# Patient Record
Sex: Male | Born: 1965 | Race: White | Hispanic: No | Marital: Married
Health system: Midwestern US, Academic
[De-identification: ages and names within clinical notes are randomized; demographics above are authoritative.]

---

## 2021-04-18 ENCOUNTER — Encounter: Admit: 2021-04-18 | Discharge: 2021-04-18

## 2021-04-18 DIAGNOSIS — I610 Nontraumatic intracerebral hemorrhage in hemisphere, subcortical: Secondary | ICD-10-CM

## 2021-04-18 DIAGNOSIS — E785 Hyperlipidemia, unspecified: Secondary | ICD-10-CM

## 2021-04-18 DIAGNOSIS — I10 Essential (primary) hypertension: Secondary | ICD-10-CM

## 2021-04-18 LAB — COMPREHENSIVE METABOLIC PANEL
ALBUMIN: 4.5 g/dL (ref 3.5–5.0)
ALK PHOSPHATASE: 60 U/L (ref 25–110)
ALT: 29 U/L (ref 7–56)
ANION GAP: 13 — ABNORMAL HIGH (ref 3–12)
AST: 32 U/L (ref 7–40)
BLD UREA NITROGEN: 16 mg/dL (ref 7–25)
CALCIUM: 9.2 mg/dL (ref 8.5–10.6)
CO2: 20 MMOL/L — ABNORMAL LOW (ref 21–30)
CREATININE: 0.8 mg/dL (ref 0.4–1.24)
EGFR: >60 mL/min (ref >60)
GLUCOSE,PANEL: 82 mg/dL (ref 70–100)
SODIUM: 137 MMOL/L (ref 137–147)
TOTAL BILIRUBIN: 0.5 mg/dL (ref 0.3–1.2)

## 2021-04-18 LAB — PTT (APTT): PTT: 32 s (ref 24.0–36.5)

## 2021-04-18 LAB — LIPID PROFILE
CHOLESTEROL: 202 mg/dL — ABNORMAL HIGH (ref <200)
HDL: 49 mg/dL (ref >40)
LDL: 122 mg/dL — ABNORMAL HIGH (ref <100)
NON HDL CHOLESTEROL: 153 mg/dL
TRIGLYCERIDES: 126 mg/dL (ref <150)
VLDL: 25 mg/dL

## 2021-04-18 LAB — PHOSPHORUS: PHOSPHORUS: 2.9 mg/dL (ref 2.0–4.5)

## 2021-04-18 LAB — MAGNESIUM: MAGNESIUM: 2.3 mg/dL (ref 1.6–2.6)

## 2021-04-18 LAB — IONIZED CALCIUM: IONIZED CALCIUM: 1 MMOL/L (ref 1.0–1.3)

## 2021-04-18 LAB — PROTIME INR (PT): PROTIME: 11 s (ref 9.5–14.2)

## 2021-04-18 MED ORDER — LISINOPRIL 10 MG PO TAB
10 mg | Freq: Every day | ORAL | 0 refills | Status: AC
Start: 2021-04-18 — End: ?
  Administered 2021-04-19: 14:00:00 10 mg via ORAL

## 2021-04-18 MED ORDER — POTASSIUM CHLORIDE IN WATER 10 MEQ/50 ML IV PGBK
10 meq | INTRAVENOUS | 0 refills | Status: AC | PRN
Start: 2021-04-18 — End: ?

## 2021-04-18 MED ORDER — DOCUSATE SODIUM 100 MG PO CAP
100 mg | Freq: Two times a day (BID) | ORAL | 0 refills | Status: AC
Start: 2021-04-18 — End: ?

## 2021-04-18 MED ORDER — POTASSIUM CHLORIDE 20 MEQ/15 ML PO LIQD
40-60 meq | NASOGASTRIC | 0 refills | Status: AC | PRN
Start: 2021-04-18 — End: ?

## 2021-04-18 MED ORDER — LABETALOL 5 MG/ML IV SYRG
10 mg | INTRAVENOUS | 0 refills | Status: AC | PRN
Start: 2021-04-18 — End: ?

## 2021-04-18 MED ORDER — MAGNESIUM SULFATE IN D5W 1 GRAM/100 ML IV PGBK
1 g | INTRAVENOUS | 0 refills | Status: AC | PRN
Start: 2021-04-18 — End: ?

## 2021-04-18 MED ORDER — HYDRALAZINE 20 MG/ML IJ SOLN
10 mg | INTRAVENOUS | 0 refills | Status: AC | PRN
Start: 2021-04-18 — End: ?

## 2021-04-18 MED ORDER — ACETAMINOPHEN 325 MG PO TAB
650 mg | ORAL | 0 refills | Status: AC | PRN
Start: 2021-04-18 — End: ?

## 2021-04-18 MED ORDER — ONDANSETRON HCL (PF) 4 MG/2 ML IJ SOLN
4 mg | INTRAVENOUS | 0 refills | Status: AC | PRN
Start: 2021-04-18 — End: ?

## 2021-04-18 MED ORDER — POTASSIUM CHLORIDE 20 MEQ PO TBTQ
40-60 meq | ORAL | 0 refills | Status: AC | PRN
Start: 2021-04-18 — End: ?

## 2021-04-18 MED ORDER — ACETAMINOPHEN 160 MG/5 ML PO SOLN
650 mg | NASOGASTRIC | 0 refills | Status: AC | PRN
Start: 2021-04-18 — End: ?

## 2021-04-18 MED ORDER — MAGNESIUM HYDROXIDE 2,400 MG/10 ML PO SUSP
10 mL | Freq: Every day | ORAL | 0 refills | Status: AC
Start: 2021-04-18 — End: ?

## 2021-04-18 MED ORDER — SENNOSIDES-DOCUSATE SODIUM 8.6-50 MG PO TAB
1 | Freq: Two times a day (BID) | ORAL | 0 refills | Status: AC
Start: 2021-04-18 — End: ?

## 2021-04-18 MED ORDER — ATORVASTATIN 20 MG PO TAB
20 mg | Freq: Every evening | ORAL | 0 refills | Status: DC
Start: 2021-04-18 — End: 2021-04-19

## 2021-04-18 MED ORDER — CALCIUM GLUC IN NACL, ISO-OSM 1 GRAM/100 ML IV SOLN
1 g | INTRAVENOUS | 0 refills | Status: AC | PRN
Start: 2021-04-18 — End: ?

## 2021-04-19 ENCOUNTER — Inpatient Hospital Stay: Admit: 2021-04-19 | Discharge: 2021-04-19 | Payer: Private Health Insurance - Indemnity

## 2021-04-19 ENCOUNTER — Inpatient Hospital Stay: Admit: 2021-04-19

## 2021-04-19 LAB — TSH WITH FREE T4 REFLEX: TSH: 1.4 uU/mL (ref 0.35–5.00)

## 2021-04-19 MED ORDER — ATORVASTATIN 40 MG PO TAB
40 mg | Freq: Every evening | ORAL | 0 refills | Status: AC
Start: 2021-04-19 — End: ?

## 2021-04-19 MED ORDER — LACTATED RINGERS IV SOLP
1000 mL | INTRAVENOUS | 0 refills | Status: AC
Start: 2021-04-19 — End: ?
  Administered 2021-04-19: 08:00:00 1000 mL via INTRAVENOUS

## 2021-04-19 MED ADMIN — GADOBENATE DIMEGLUMINE 529 MG/ML (0.1MMOL/0.2ML) IV SOLN [135881]: 20 mL | INTRAVENOUS | @ 10:00:00 | Stop: 2021-04-19 | NDC 00270516415

## 2021-04-19 NOTE — H&P (View-Only)
Neuro Critical Care History and Physical      Frank Hines  Admission Date: 04/18/2021  LOS: 0 days  No Order                      ASSESSMENT/PLAN     Patient Active Problem List    Diagnosis Date Noted   ? 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 Dyslipidemia    ? Primary hypertension    ? Distortion of visual image of left eye        Frank Hines is a 55 y.o. male with a PMH of hypertension and dyslipidemia. He works as a Emergency planning/management officer. On the evening of 12/8, he was searching a house when he developed a visual disturbance in his left visual field consisting of a beam of bright light. Symptoms did not improve and he sought treatment in the ED at [facility]. CT of the head demonstrated a small hyperdensity in the right thalamus concerning for hemorrhage.    Hospital and ICU course:   12/8: to [facility] ED, found to have right thalamic hyperdensity, transferred to [facility]    Neuro:   Thalamic hemorrhage  Left visual disturbance  - ICH order set  - Q2 neuro checks  - MRI head w/ & w/o contrast, MRA head w/o contrast  - obtain urine drug screen   - PT/OT consults     12/8 CT head:  Small 6 mm hyerdensity involving the posterior right thalamus suspicious for a small thalamic hemorrhage.     Neuro Critical Care ICH Score    GCS (3-4, or 5-12, or 13-15): 15  Age ? 80: no  ICH volume ? 30 ml: no  IVH present: no  Infratentorial origin of hemorrhage: no   Total score on admission: 0     Sedation/Pain Management:   - acetaminophen 650 Q4 PRN pain/headache  - Assess for delirium daily    Cardiac:   Primary hypertension   - SBP goal: < 160  - MAP goal > 65  - resume lisinopril 10 mg PO every day  - labetalol 10 mg IV Q15 PRN SBP > 160  - hydralazine 10 mg IV Q4 PRN SBP > 160  - EKG    Respiratory:    - PaO2 goal >100, Spo2 goal >92%    GI:  - AST 26, ALT 34, alk phos 66, total bili 0.51 at OSH  - Feeding: regular diet   - neuro bowel regimen, ensure daily BM    Heme:   - WBC 6.26, Hgb 14.3, Plt 235, INR 1.1 at OSH  - repeat CBC and coags now    ID:   - aim for normothermia, Temp <38.3 celsius, normothermia protocol if febrile    Renal:   - BUN 20, Cr 0.85 at OSH  - Monitor hourly I/O balance   - Aim for normovolemia    Endocrine:    Dyslipidemia  - Blood glucose goal 100-180mg /dl  - obtain lipid panel and TSH w/ reflex T4  - resume atorvastatin 20 mg PO QHS     FEN:   - Na 138, K 4.2, Ca 9.3 at OSH  - IVF: none  - Magnesium goal >2.0, i-Cal goal > 1.0, Potassium goal >4.0 mEq/L  - BMP, Mg, PO4, Ca++ now  - implement critical care electrolyte replacement protocol       Prophylaxis Review:   A) GI: not indicated   B) Lines:  No  C) Urinary Catheter:  No  D) Antibiotic Usage:  No  E) VTE:  Pharmacological prophylaxis; Contraindication:  Active bleeding; Cerebral hemorrhage  and Mechanical prophylaxis; Sequential compression device  F) Isolation: none  G)Seizures: none  I) Restraints: Patient assessed for need for restraints.   Disposition/Family:   Unchanged.    Primary service: neurocritical care     Consults:  None    ___________________________________________________________________  SUBJECTIVE   Chief Complaint: visual disturbance, thalamic IPH    History of Present Illness: Frank Hines is a 55 y.o. male with a history of hypertension and dyslipidemia. He works as a Emergency planning/management officer. On the evening of 12/8, he was searching a house when he developed a visual disturbance in his left visual field consisting of a beam of bright light. Symptoms did not improve and he sought treatment in the ED at [facility]. CT of the head demonstrated a small hyperdensity in the right thalamus concerning for hemorrhage.    Medical History:   Diagnosis Date   ? Dyslipidemia    ? Primary hypertension        Surgical History:   Procedure Laterality Date   ? TONSILLECTOMY         Family History   Problem Relation Age of Onset   ? Cancer-Breast Mother    ? Heart Attack Father    ? 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 He lives at home with his wife and 76 year old daughter.           Code Status: Full Code    Decision Maker: patient, surrogate is wife Frank Hines)     Immunizations (includes history and patient reported):   Immunization History   Administered Date(s) Administered   ? COVID-19 (MODERNA), mRNA vacc, 100 mcg/0.5 mL (PF) 06/09/2019, 07/07/2019   ? HEP A/HEP B Combined Vaccine 09/05/2013, 10/07/2013, 03/17/2014           Allergies:  Patient has no known allergies.    Medications Prior to Admission   Medication Sig   ? aspirin EC 81 mg tablet Take 81 mg by mouth daily. Take with food.   ? atorvastatin (LIPITOR) 20 mg tablet .COMPLEX   ? lisinopriL (ZESTRIL) 10 mg tablet .COMPLEX   ? MULTIVITAMIN PO Take  by mouth.       Review of Systems:  Review obtained from patient.  Constitutional: negative  Eyes: positive for visual disturbance  Ears, nose, mouth, throat, and face: positive for sinus pressure  Respiratory: negative  Cardiovascular: negative  Gastrointestinal: negative  Genitourinary:negative  Musculoskeletal:negative  Neurological: negative  Behavioral/Psych: negative      OBJECTIVE                     Vital Signs: Last Filed                  Vital Signs: 24 Hour Range           Intensity Pain Scale (Self Report): (not recorded) Vitals:    04/18/21 2250   Weight: 96.4 kg (212 lb 8.4 oz)         Artificial airway:  None              Ventilator/ Respiratory Therapy:  No   Vent weaning trial:  Not applicable    Lines:  Peripheral Line  Drains: None  Intake/Output Summary:  (Last 24 hours)  No intake or output data in the 24 hours ending 04/18/21 2315         Physical Exam:    Blood pressure (!) 157/100, weight 96.4 kg (212 lb 8.4 oz), SpO2 99 %.    Glasgow coma score: 15             E: 4 - Opens eyes on own          M: 6 - Follows simple motor commands              V: 5 - Alert and oriented    Neuro:   Mental Status: keenly alert, oriented x 3    Cranial Nerves: Cranial nerves 2-12 INTACT by inspection.      - Pupil exam: Size: 3 mm and briskly reactive bilaterally     - EOM: intact, no nystagmus                  - Corneal reflex: R - present L - present    - Grimace/facial movement: present      Motor:         RUE: Strength: 5/5                    RLE: Strength: 5/5            LUE: Strength: 5/5         LLE: Strength: 5/5   Sensory: normal    Lungs: clear to auscultation bilaterally                     Pulmonary:  airway patent, not on supplemental oxygen   Heart: regular rate and rhythm, S1, S2 normal, no murmur, click, rub or gallop  Abdomen: soft, non-tender. Bowel sounds normal. No masses,  no organomegaly  Extremities: extremities normal, atraumatic, no cyanosis or edema  Skin: Skin color, texture, turgor normal. No rashes or lesions    Point of Care Testing:  (Last 24 hours):       Lab Review:  Reviewed from OSH and noted as per above    Radiology and Other Diagnostic Procedures Review:  Pertinent radiologic and diagnostic procedures reviewed.      I spent 38 minutes managing the care of this patient. Frank Hines is in critical condition with suspected thalamic hemorrhage with visual disturbance. Will obtain MRI/MRA to evaluate for underlying mass or vascular anomaly. Cares included: detailed neurologic and systems exam, medication review, laboratory data review and interpretation, electrolyte management, review of available imaging, DVT/PE prophylaxis review, diet review, activity review, and coordination of care with consulted teams     Frank Muskrat, APRN-NP Date:  04/18/2021   [phone]

## 2021-04-19 NOTE — Progress Notes
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  Had flashlight in L hand above eye to search with and  saw lateral bright light in L eye.  Removed flashlight and continued to see light.  No HA, No N/V.    CT: 6 mm hyperdensity in posterior R thalmus suspicious for a small hemorrhage.  Needs MRI - not available at sending tonight.  Images being uploaded to the cloud.    PmHx: HTN, HLD    130/80 - SR 100- RR 16 - 97 - 97% on RA; A & O x 4    Neuro intact.  Visual loss in lateral fields  Flashing still intermittently present, especially when moves eye, even when eyelid closed.    Labs:  Not back    Wears contacts.

## 2021-04-25 ENCOUNTER — Encounter: Admit: 2021-04-25 | Discharge: 2021-04-25 | Payer: Private Health Insurance - Indemnity

## 2021-04-25 ENCOUNTER — Ambulatory Visit: Admit: 2021-04-25 | Discharge: 2021-04-25 | Payer: Private Health Insurance - Indemnity

## 2021-04-25 DIAGNOSIS — H2513 Age-related nuclear cataract, bilateral: Secondary | ICD-10-CM

## 2021-04-25 DIAGNOSIS — I10 Essential (primary) hypertension: Secondary | ICD-10-CM

## 2021-04-25 DIAGNOSIS — E785 Hyperlipidemia, unspecified: Secondary | ICD-10-CM

## 2021-04-25 DIAGNOSIS — H43393 Other vitreous opacities, bilateral: Principal | ICD-10-CM

## 2021-04-25 NOTE — Progress Notes
Body mass index is 31.01 kg/m.    OCT:  OD: Preserved foveal contour with distinguished retinal layers.  OS: Preserved foveal contour with distinguished retinal layers.    MRI demonstrates a small lesion in the thalamus which may represent a bleed vs vascular lesion per Dr.Whittaker's note.       Assessment and Plan:         Referral from Dr.Whottaker for photopsia      HTN on ttt  DLP on ttt  No DM    No heart, liver or kidney disease    No hx of migraines    1. Photopsia (cannot tell which eye)  New onset flashes last Thursday evening (04/18/2021), went to the ED on the same day  - Reports acute onset of bright crescent of light, worse in extremities of gaze, with no other visual or neurologic complaints  Reports decreasing flashes since last week  Not CNS related per neuro-ophthalmology note    2- Vitreous syneresis OU  No retinal tear, no retinal detachment, no vitreous hemorrhage, retina all attached.  Signs and symptoms of retinal tears and retinal detachment were reviewed in details with the patient. Patient was instructed to immediately present for evaluation or seek medical help if increased flashes, increased floaters, any decrease in vision, or curtain in field of vision.    F/u 6-8w or sooner prn  Oct, optos

## 2021-04-29 ENCOUNTER — Encounter: Admit: 2021-04-29 | Discharge: 2021-04-29 | Payer: Private Health Insurance - Indemnity

## 2021-04-29 NOTE — Telephone Encounter
Pt calling triage, he needs a letter lifting his restriction from driving. Tried to have his ICU team do it but it needs to be completed by ophthalmologist.

## 2021-05-02 ENCOUNTER — Encounter: Admit: 2021-05-02 | Discharge: 2021-05-02 | Payer: Private Health Insurance - Indemnity

## 2021-05-02 NOTE — Telephone Encounter
Message received -   Shariff Lasky MRN [id number] called, he was [facility] 12/9 with driving restrictions, "No driving until physician approval at follow-up appointment. Please obtain clearance from [facility] before operating a vehicle or machinery."   He saw [facility] today.  He says they verbally cleared him but their doc says that Neurology is who has to clear him to drive and return to work.  He's yet to hear from Neuro for post hospital follow up appointment.  Will you please call him [phone] and help him along with this? ....  Thank you in advance, [facility])    Patient now scheduled to see Dr. Consuella Lose on January 20th.    I saw Mr. Schiele in the hospital.  I called him on 12/21.  He reports that he is doing well.  I informed him that I would discuss with Ophthalmology and then get back to him.  Later on 12/21, I was able to reach Dr. Elpidio Anis.  Dr. Elpidio Anis had seen Mr. Vizcarrondo in outpatient clinic and reported that there was no vision reason that would restrict him from driving or returning to work.  I will write a release to work/driving note for him.    Staff name:  Karlene Einstein, MD Date:  05/02/2021

## 2021-05-31 ENCOUNTER — Ambulatory Visit: Admit: 2021-05-31 | Discharge: 2021-05-31 | Payer: BC Managed Care – PPO

## 2021-05-31 ENCOUNTER — Encounter: Admit: 2021-05-31 | Discharge: 2021-05-31 | Payer: BC Managed Care – PPO

## 2021-05-31 VITALS — BP 149/92 | HR 82 | Ht 69.0 in | Wt 210.0 lb

## 2021-05-31 DIAGNOSIS — Q283 Other malformations of cerebral vessels: Principal | ICD-10-CM

## 2021-05-31 DIAGNOSIS — E785 Hyperlipidemia, unspecified: Secondary | ICD-10-CM

## 2021-05-31 DIAGNOSIS — I10 Essential (primary) hypertension: Secondary | ICD-10-CM

## 2021-05-31 MED ORDER — LISINOPRIL 20 MG PO TAB
20 mg | ORAL_TABLET | Freq: Every day | ORAL | 3 refills | Status: AC
Start: 2021-05-31 — End: ?

## 2021-05-31 NOTE — Progress Notes
Date of Service: 05/31/2021    Subjective:             Frank Hines is a 56 y.o. male who is presenting post hospital follow up of a R thalamic ICH and visual change.     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 He had OSH CT brain showing a small R pulvinar hyperdensity c/w hemorrhage and he was brought to [place] where an MRI showed a lesion in the right pulvinar thalamus with an associated developmental venous anomaly, most compatible with a benign incidental vascular lesion such as a small cavernoma or atypical capillary telangiectasia. MRA was normal. Neurosurgery was consulted for this and did not recommend any acute Neurosurgical intervention. Spot EEG was also performed due to the positive symptoms that showed a bilateral independent temporal structural deficit. No epileptiform findings were seen.     The symptoms since discharge has improved but patient still something sees this bright crescent of light in left lateral field, especially when he is going outside from indoors, but it resolves soon. No other neurological symptoms. He has seen ophthalmology and found to have no visual field deficits or retinal issues. He has been cleared to drive and work. Regarding his history of HTN, he has been on Lisinopril 10 mg daily for many years and does not routinely check his BP at home.      Review of Systems   Constitutional: Negative.    HENT: Positive for tinnitus.    Eyes: Positive for visual disturbance.   Respiratory: Negative.    Cardiovascular: Negative.    Gastrointestinal: Negative.    Endocrine: Negative.    Genitourinary: Negative.    Musculoskeletal: Negative.    Skin: Negative.    Allergic/Immunologic: Negative.    Neurological: Negative.    Hematological: Negative.    Psychiatric/Behavioral: Negative. Objective:         ? aspirin EC 81 mg tablet Take 81 mg by mouth daily. Take with food.   ? atorvastatin (LIPITOR) 40 mg tablet Take one tablet by mouth at bedtime daily.   ? lisinopriL (ZESTRIL) 10 mg tablet Take 10 mg by mouth daily.   ? multivitamin (ONE-A-DAY) tablet Take 1 tablet by mouth daily.     Vitals:    05/31/21 1447   BP: (!) 149/92   Pulse: 82   PainSc: Zero   Weight: 95.3 kg (210 lb)   Height: 175.3 cm (5' 9)     Body mass index is 31.01 kg/m?Marland Kitchen     Physical Exam         General: Well-appearing, well nourished, in no apparent distress  HEENT: Normocephalic, atraumatic, moist mucus membranes  Extremities: Normal range of motion    Neuro Exam:  Mental Status: Alert and oriented to all modalities. Normal gross attention, memory, concentration, cognition  Speech: Fluent without dysarthria  CN: Visual fields full to confrontation, PERRLA, EOMI, no facial numbness, strength of facial muscles is full and symmetric, hearing intact to conversation, symmetric palate elevation, tongue midline, intact shoulder shrug  Motor: Normal tone/bulk, strength 5/5 throughout  Sensory: Grossly intact to light touch throughout  Coordination: Normal finger to nose   Gait: Normal, intact tandem gait     MRI brain and MRA head 04/19/21:  IMPRESSION  MR brain:     Stable small T1 hyperintense susceptibility lesion in the right pulvinar   thalamus with an associated developmental venous anomaly, most compatible   with a benign incidental vascular lesion such as a small cavernoma or   atypical capillary telangiectasia.     MRA head:     Normal MRI of the head.     Routine EEG:  Clinical correlation:   This abnormal routine EEG is indicative of a bilateral independent temporal structural deficit. No epileptiform findings are seen.      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   Symptoms of visual disturbance is likely related to this lesion due to time course (was present on CT brain at OSH) and that it is near the [facility] structure. Will need improved BP control to prevent future symptoms.   Plan:  - increase Lisinopril to 20 mg daily, recommend to record BP at home and f/u with PCP. Goal is less than 130/80.  - f/u MRI brain with and w/o contrast in 3 months from previous (around march 2023)  - at this time, no absolute contraidincation to Aspirin, but may decide to hold in future if new bleed or worsening findings on repeat MRI  - cleared to drive and work due to no visual field deficits    RTC as needed.       I spent 40 minutes with the patient, with 25 minutes in counseling and coordination of care.       Pierce Crane, MD

## 2021-05-31 NOTE — Patient Instructions
Go up to Lisinopril 20 mg daily. Keep a record of BP at home and bring into next PCP office. We want it to be less than 130/80. Also, if it's too low (90/50 or less), let us know.     Schedule MRI repeat in late March.

## 2021-06-13 ENCOUNTER — Ambulatory Visit: Admit: 2021-06-13 | Discharge: 2021-06-13 | Payer: BC Managed Care – PPO

## 2021-06-13 ENCOUNTER — Encounter: Admit: 2021-06-13 | Discharge: 2021-06-13 | Payer: BC Managed Care – PPO

## 2021-06-13 DIAGNOSIS — H2513 Age-related nuclear cataract, bilateral: Secondary | ICD-10-CM

## 2021-06-13 DIAGNOSIS — E785 Hyperlipidemia, unspecified: Secondary | ICD-10-CM

## 2021-06-13 DIAGNOSIS — H43393 Other vitreous opacities, bilateral: Secondary | ICD-10-CM

## 2021-06-13 DIAGNOSIS — I10 Essential (primary) hypertension: Secondary | ICD-10-CM

## 2021-06-13 NOTE — Progress Notes
Body mass index is 31.01 kg/m.       OCT:  OD: Preserved foveal contour with distinguished retinal layers.  OS: Preserved foveal contour with distinguished retinal layers.      B-scan:  OD: vitreous syneresis. No rt, no rd  OS:  vitreous syneresis. No rt, no rd        Assessment and Plan:         Referral from Dr.Whottaker for photopsia      HTN on ttt  DLP on ttt  No DM    No heart, liver or kidney disease    No hx of migraines    1.Photopsia (cannot tell which eye)  New onset flashes last Thursday evening (04/18/2021), went to the ED on the same day  -Reports acute onset of bright crescent of light, worse in extremities of gaze, with no other visual or neurologic complaints  Reports decreasing flashes since last week  Not CNS related per neuro-ophthalmology note    2- Vitreous syneresis OU  No retinal tear, no retinal detachment, no vitreous hemorrhage, retina all attached.  Signs and symptoms of retinal tears and retinal detachment were reviewed in details with the patient. Patient was instructed to immediately present for evaluation or seek medical help if increased flashes, increased floaters, any decrease in vision, or curtain in field of vision.    F/u 6-23m or sooner prn  Oct, optos

## 2021-07-29 ENCOUNTER — Ambulatory Visit: Admit: 2021-07-29 | Discharge: 2021-07-29 | Payer: BC Managed Care – PPO

## 2021-07-29 ENCOUNTER — Encounter: Admit: 2021-07-29 | Discharge: 2021-07-29 | Payer: BC Managed Care – PPO

## 2021-07-29 DIAGNOSIS — Q283 Other malformations of cerebral vessels: Secondary | ICD-10-CM

## 2021-07-29 MED ORDER — GADOBENATE DIMEGLUMINE 529 MG/ML (0.1MMOL/0.2ML) IV SOLN
20 mL | Freq: Once | INTRAVENOUS | 0 refills | Status: CP
Start: 2021-07-29 — End: ?
  Administered 2021-07-29: 19:00:00 20 mL via INTRAVENOUS

## 2021-07-30 ENCOUNTER — Encounter: Admit: 2021-07-30 | Discharge: 2021-07-30 | Payer: BC Managed Care – PPO

## 2021-07-30 NOTE — Telephone Encounter
-----   Message from Gustavo LahSabreena J Slavin, MD sent at 07/30/2021 12:16 PM CDT -----  Please let Mr. Frank Hines know that his MRI is consistent with a small collection of blood vessels similar to the previous scan. This does not need any further follow up unless he has any new symptoms. He should continue keeping his BP under control to reduce risk of this bleeding again.   ----- Message -----  From: Pricilla HolmKu Interface, In Radiant Results  Sent: 07/30/2021  11:39 AM CDT  To: Gustavo LahSabreena J Slavin, MD

## 2021-07-30 NOTE — Telephone Encounter
Went over MRI results with Rosie Fate per Dr. Daron Offer review.  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  He thanked me for calling.

## 2022-01-14 ENCOUNTER — Encounter: Admit: 2022-01-14 | Discharge: 2022-01-14 | Payer: BC Managed Care – PPO

## 2022-08-31 ENCOUNTER — Encounter: Admit: 2022-08-31 | Discharge: 2022-08-31 | Payer: BC Managed Care – PPO

## 2022-08-31 MED ORDER — LISINOPRIL 20 MG PO TAB
20 mg | ORAL_TABLET | Freq: Every day | ORAL | 0 refills
Start: 2022-08-31 — End: ?

## 2022-11-28 IMAGING — CT BRAIN WO(Adult)
2 of 4 series · 12 of 47 positions shown, 15 images · non-contrast
Comparison: none

[Series 4: brain cor 5.00 hr40 s3 · coronal · 0.33mm/px · 3 of 49 slices shown]
[im 17/49  brain]
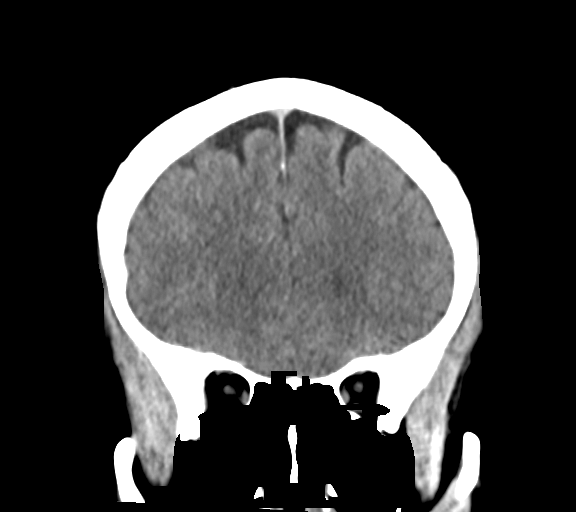
[im 22/49  brain]
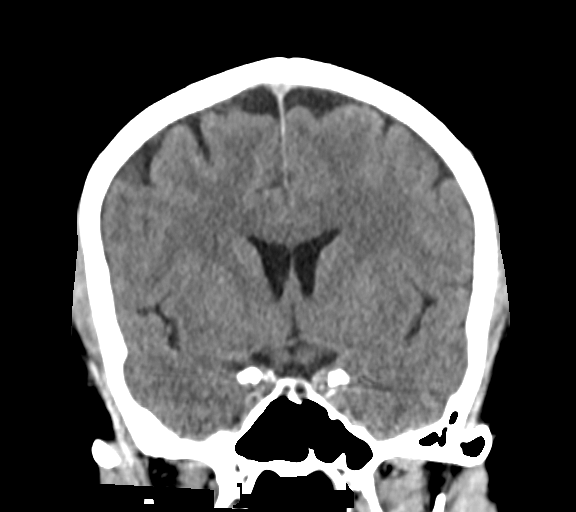
[im 27/49  brain]
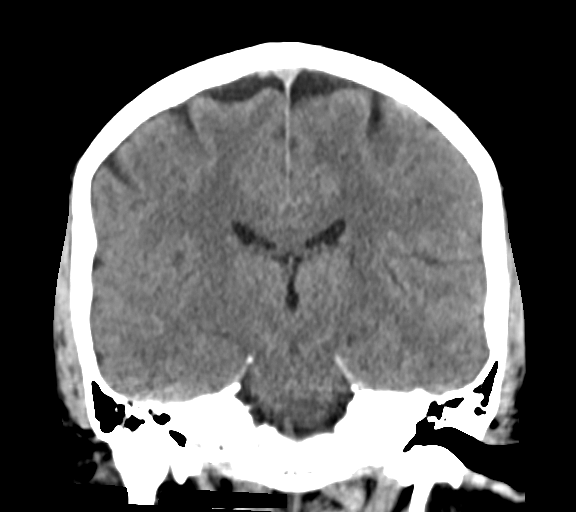

[Series 8: brain ax 2.00 hr60 s3 · axial · 0.37mm/px · z∈[-499,-363]mm · 9 of 86 slices shown, 12 images]
[im 9/86  brain]
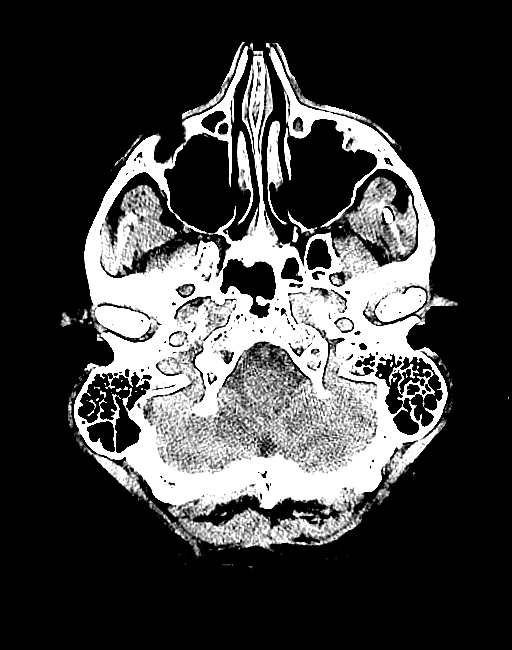
[im 9/86  bone]
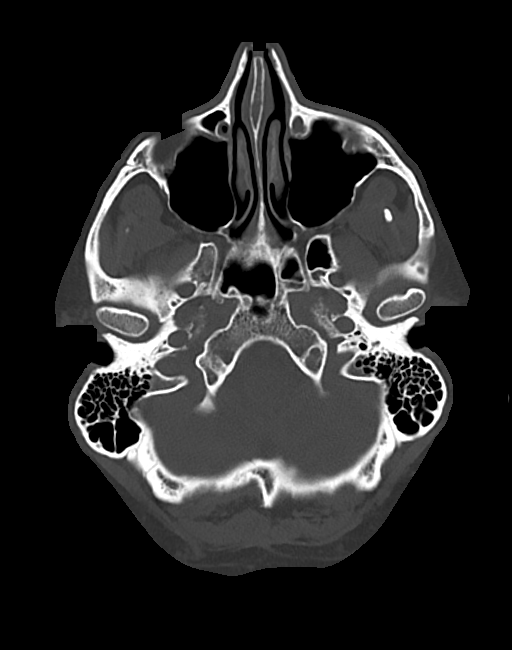
[im 17/86  brain]
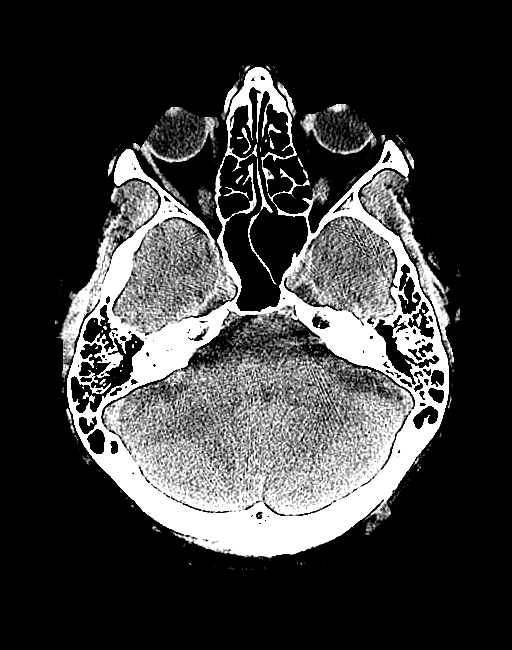
[im 25/86  brain]
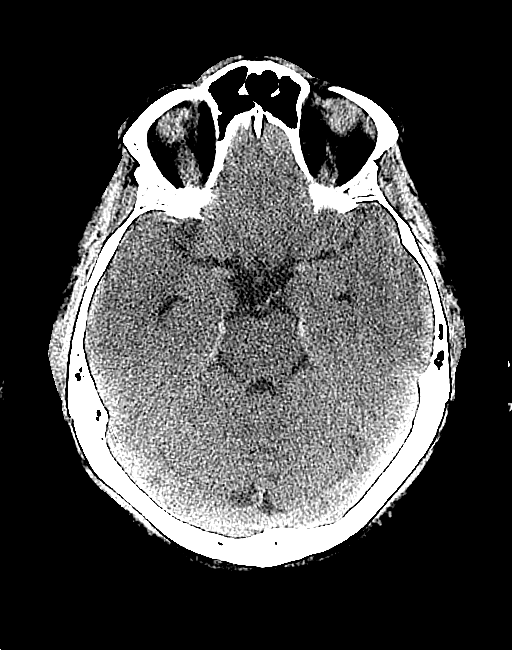
[im 33/86  brain]
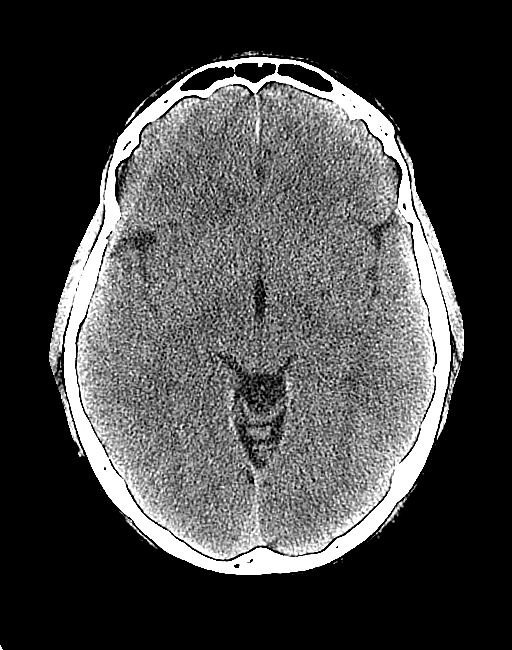
[im 45/86  brain]
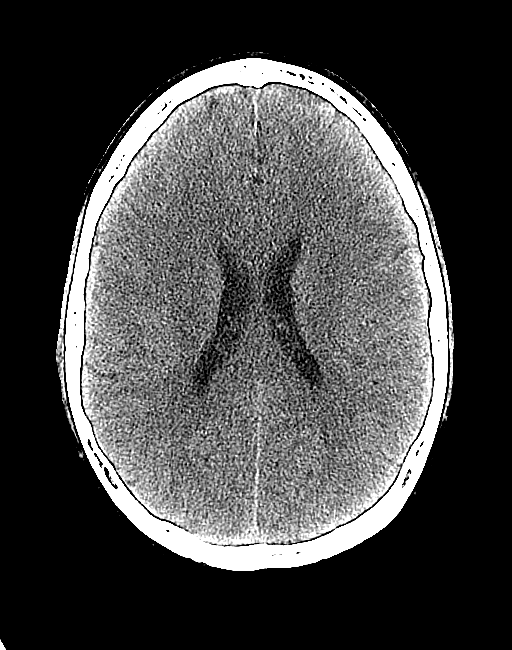
[im 45/86  bone]
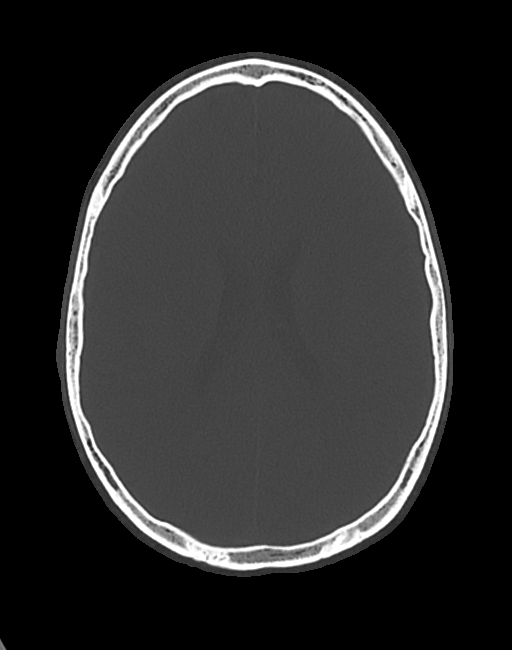
[im 53/86  brain]
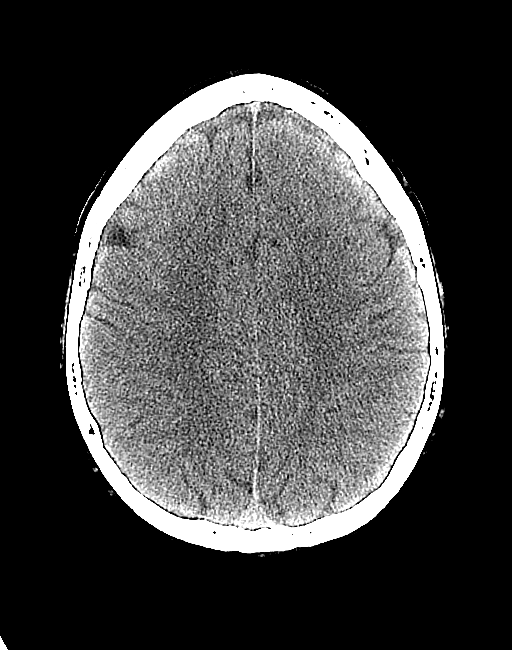
[im 61/86  brain]
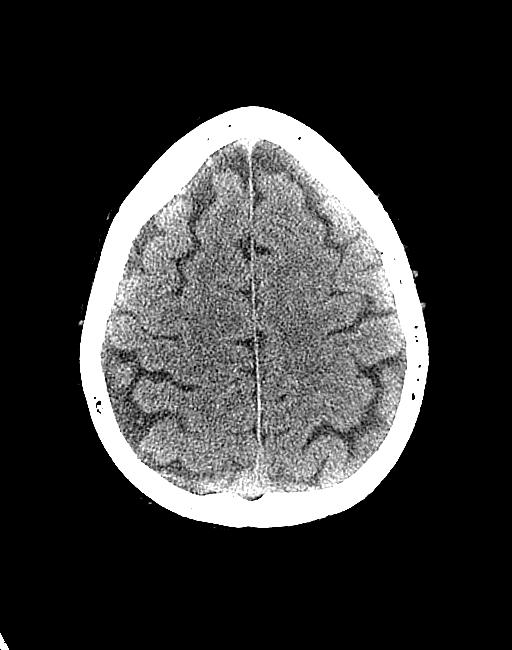
[im 69/86  brain]
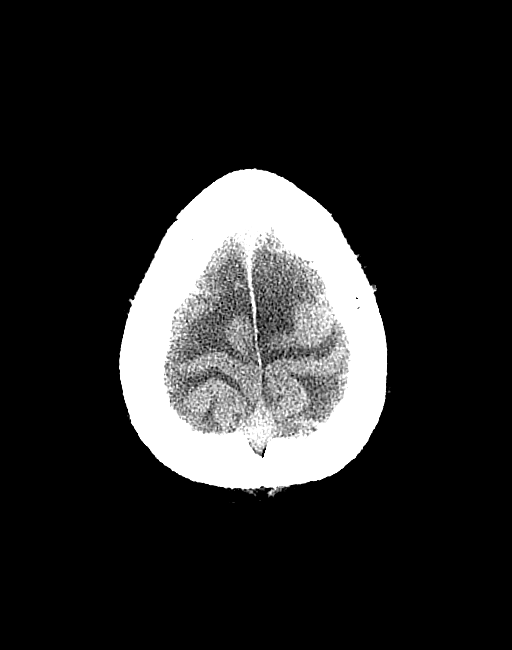
[im 77/86  brain]
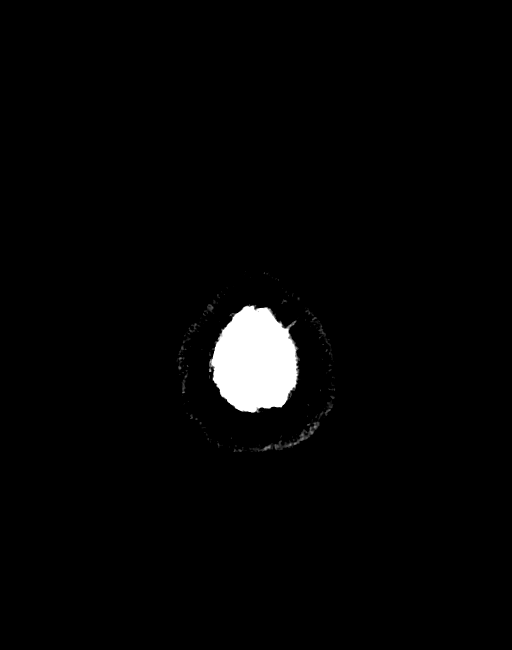
[im 77/86  bone]
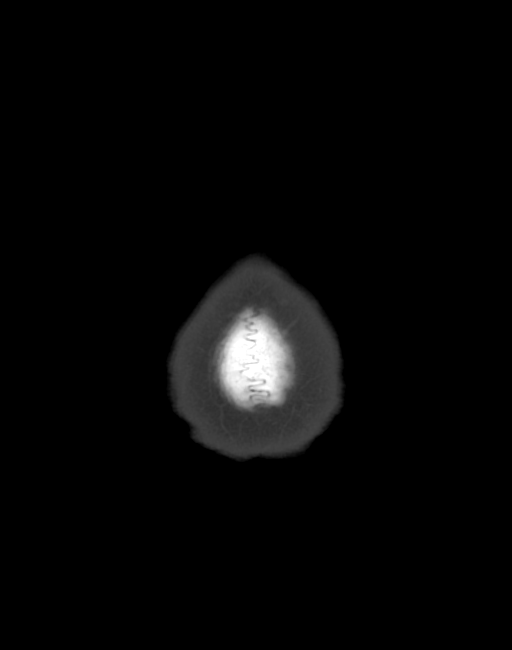

[12 of 47 positions shown; findings below may reference images not displayed]

DIAGNOSTIC STUDIES

EXAM

CT of the head was performed without IV contrast.

INDICATION

visual disturbance left eye
vision changes in the left eye , onset 45 mins ago ,

TECHNIQUE

All CT scans at this facility use dose modulation, iterative reconstruction, and/or weight based
dosing when appropriate to reduce radiation dose to as low as reasonably achievable.

CT of the head was performed without IV contrast. Thin cut axial slices were performed with
sagittal and coronal reformatted images.

Number of previous computed tomography exams in the last 12 months is 0  .

Number of previous nuclear medicine myocardial perfusion studies in the last 12 months is 0  .

COMPARISONS

None

FINDINGS

There is partial opacification of the ethmoidal air cells. The bony calvarium is unremarkable.
There is a small 6 millimeter hyperdensity involving the posterior right thalamus suspicious for a
small thalamic hemorrhage. No extra-axial fluid collections or evidence of acute cortical based
infarct is identified.

IMPRESSION

6 millimeter hyperdensity involving the posterior right thalamus suspicious for a small
hemorrhage. Follow-up MRI of the brain without and with contrast is recommended to exclude
underlying mass.

The critical results were called to the ER department at [DATE] p.m. on 04/18/2021

Tech Notes:

vision changes in the left eye , onset 45 mins ago ,
